# Patient Record
Sex: Female | Born: 1955 | Race: White | Hispanic: No | Marital: Married | Smoking: Never smoker
Health system: Southern US, Community
[De-identification: ages and names within clinical notes are randomized; demographics above are authoritative.]

---

## 2000-09-05 ENCOUNTER — Encounter: Admission: RE | Admit: 2000-09-05 | Discharge: 2000-09-05 | Payer: Self-pay | Admitting: Neurosurgery

## 2000-09-05 ENCOUNTER — Encounter: Payer: Self-pay | Admitting: Neurosurgery

## 2000-09-20 ENCOUNTER — Encounter: Admission: RE | Admit: 2000-09-20 | Discharge: 2000-09-20 | Payer: Self-pay | Admitting: Neurosurgery

## 2000-09-20 ENCOUNTER — Encounter: Payer: Self-pay | Admitting: Neurosurgery

## 2000-10-07 ENCOUNTER — Encounter: Admission: RE | Admit: 2000-10-07 | Discharge: 2000-10-07 | Payer: Self-pay | Admitting: Neurosurgery

## 2000-10-07 ENCOUNTER — Encounter: Payer: Self-pay | Admitting: Neurosurgery

## 2000-12-26 ENCOUNTER — Encounter: Admission: RE | Admit: 2000-12-26 | Discharge: 2001-01-25 | Payer: Self-pay | Admitting: Anesthesiology

## 2001-01-03 ENCOUNTER — Encounter: Payer: Self-pay | Admitting: Neurosurgery

## 2001-01-03 ENCOUNTER — Inpatient Hospital Stay (HOSPITAL_COMMUNITY): Admission: RE | Admit: 2001-01-03 | Discharge: 2001-01-04 | Payer: Self-pay | Admitting: Neurosurgery

## 2001-01-21 ENCOUNTER — Encounter: Payer: Self-pay | Admitting: Neurosurgery

## 2001-01-21 ENCOUNTER — Encounter: Admission: RE | Admit: 2001-01-21 | Discharge: 2001-01-21 | Payer: Self-pay | Admitting: Neurosurgery

## 2004-12-27 ENCOUNTER — Ambulatory Visit: Payer: Self-pay | Admitting: Family Medicine

## 2005-01-08 ENCOUNTER — Other Ambulatory Visit: Admission: RE | Admit: 2005-01-08 | Discharge: 2005-01-08 | Payer: Self-pay | Admitting: Family Medicine

## 2005-01-08 ENCOUNTER — Ambulatory Visit: Payer: Self-pay | Admitting: Family Medicine

## 2005-01-17 ENCOUNTER — Encounter: Admission: RE | Admit: 2005-01-17 | Discharge: 2005-01-17 | Payer: Self-pay | Admitting: Family Medicine

## 2005-02-01 ENCOUNTER — Encounter: Admission: RE | Admit: 2005-02-01 | Discharge: 2005-02-01 | Payer: Self-pay | Admitting: Family Medicine

## 2005-02-01 ENCOUNTER — Encounter (INDEPENDENT_AMBULATORY_CARE_PROVIDER_SITE_OTHER): Payer: Self-pay | Admitting: *Deleted

## 2005-04-06 ENCOUNTER — Ambulatory Visit: Payer: Self-pay | Admitting: Internal Medicine

## 2005-04-26 ENCOUNTER — Ambulatory Visit: Payer: Self-pay | Admitting: Internal Medicine

## 2005-05-17 ENCOUNTER — Ambulatory Visit: Payer: Self-pay | Admitting: Internal Medicine

## 2005-06-12 ENCOUNTER — Ambulatory Visit: Payer: Self-pay | Admitting: Internal Medicine

## 2005-07-11 ENCOUNTER — Ambulatory Visit (HOSPITAL_COMMUNITY): Admission: RE | Admit: 2005-07-11 | Discharge: 2005-07-11 | Payer: Self-pay | Admitting: Neurosurgery

## 2006-02-07 ENCOUNTER — Ambulatory Visit (HOSPITAL_COMMUNITY): Admission: RE | Admit: 2006-02-07 | Discharge: 2006-02-08 | Payer: Self-pay | Admitting: Orthopedic Surgery

## 2010-06-19 ENCOUNTER — Encounter: Payer: Self-pay | Admitting: Neurosurgery

## 2010-10-13 NOTE — Op Note (Signed)
NAMEADRYAN, Catherine Sanchez NO.:  [id number]   MEDICAL RECORD NO.:  [id number]          PATIENT TYPE:  AMB   LOCATION:  DAY                          FACILITY:  [facility]   PHYSICIAN:  Marlowe Kays, M.D.  DATE OF BIRTH:  Sep 26, 1955   DATE OF PROCEDURE:  02/07/2006  DATE OF DISCHARGE:                                 OPERATIVE REPORT   PREOPERATIVE DIAGNOSES:  1. Painful degenerative arthritis, acromioclavicular joint.  2. Rotator cuff tendinopathy, left shoulder.   POSTOPERATIVE DIAGNOSES:  1. Painful degenerative arthritis, acromioclavicular joint.  2. Rotator cuff tendinopathy, left shoulder.   OPERATIONS:  1. Left shoulder arthroscopy with debridement of labrum, and arthroscopic      subacromial decompression.  2. Open resection distal clavicle left shoulder.   SURGEON:  Marlowe Kays, M.D.   ASSISTANTDruscilla Brownie. Underwood, P.A.-C.   ANESTHESIA:  General.   INDICATIONS FOR PROCEDURE:  She has a long and rather complex history of  left shoulder problems complicated by the fact that she has also had a  cervical fusion performed by a neurosurgeon here in town.  She had a left  shoulder 3-hole debridement and decompression, also by another orthopedist,  here in town in 2002.  She has noted progressive pain in her left shoulder  with an MRI demonstrating some residual rotator cuff tendinopathy and edema  of the distal clavicle.  She has failed to have any long term improvement  with injection of steroids of the distal clavicle, consequently she is here  today for the above-mentioned surgery.   PROCEDURE:  After prophylactic antibiotics and satisfactory general  anesthesia, beach chair position on the [place] frame, the left shoulder  girdle was prepped with DuraPrep and draped in a sterile field.  The anatomy  of the shoulder joint was marked out including distal clavicle excision.  All these areas of this incision, subacromial space and lateral and  posterior  portals were infiltrated with 1/2% Marcaine with adrenaline.  Through the posterior soft spot portal, with some difficulty, I was able to  enter the glenohumeral joint. There were some minor fibrous bands, some  degeneration of the labrum, but the biceps tendon and rotator cuff appeared  to be intact.  I advanced the scope between the biceps tendon and  subscapularis using the switching stitch, and over this made an incision  followed by a metal cannula and a 4.2 shaver and I debrided out some of the  fibrous bands and also debrided down the labrum.  I then redirected the  scope in the subacromial area.  I had difficulty not only getting in, but  because of the scar, once I was in there were dense adhesions present.  Through a lateral portal I was able to get a blunt trocar in the subacromial  space but only with Mr. Idolina Primer pulling down, aggressively, on the arm.   Once in, we went through two 4.2 shavers but were unable to breakdown the  fibrous tissue.  We ultimately went to a 5.2 aggressive shaver and I was  finally able to clean up the subacromial space  with it.  As noted in the MRI  she had had a very thorough acromioplasty previously, and we really did not  have to remove much bone, normally a good bit of soft tissue which was  clogging up the subacromial space.  She did have impingement on the rotator  cuff, but from the distal clavicle which I had planned to resect.  Consequently I then removed fluid from the subacromial space and made an  open incision over the distal clavicle.  I identified the [facility] joint and then  measured 1.5 cm medialward, marked the clavicle with the cautery, and then  gently undermined it with small periosteal elevators.  I then placed some  baby Homan's and used a micro saw to resect the clavicle at this point.  There were no residual spurs present.   I covered the raw bone with bone wax, irrigated the wound well and then  filled the resection gap with  Gelfoam.  I then closed the fascia over the  resected area with interrupted #1 Vicryl.  The subcutaneous tissue with 2-0  Vicryl, the skin with Steri-Strips and 3 portals.  This wound was all  infiltrated, once again, with 1/2% Marcaine with adrenaline and the portals  closed with 4-0 Nylon followed by Betadine, Adaptic, dry sterile dressing,  and shoulder immobilizer.  She tolerated the procedure well; and at the time  of this dictation was on her way to the recovery room in satisfactory  condition with no known complications.           ______________________________  Marlowe Kays, M.D.     JA/MEDQ  D:  02/07/2006  T:  02/08/2006  Job:  [id number]

## 2010-10-13 NOTE — H&P (Signed)
[place]  Patient:    Catherine Sanchez, Catherine Sanchez                        MRN: [id number] Adm. Date:  01/03/01 Attending:  Payton Doughty, M.D.                         History and Physical  ADMITTING DIAGNOSIS:  Herniated disk at C5-C6.  SERVICE:  Neurosurgery.  HISTORY OF PRESENT ILLNESS:  A 55 year old right-handed white girl, since February has had pain in neck out of her left shoulder and to her left arm. MRI demonstrated disk at C5-C6 eccentric to the left.  She underwent epidural steroids with no help, has had worsening pain in her left arm, and she is now admitted for an anterior cervical diskectomy and fusion.  MEDICAL HISTORY:  Otherwise unremarkable.  MEDICATIONS:  She is not on any medications.  She has tried numerous anti-inflammatories to no avail.  ALLERGIES:  PENICILLIN.  SURGICAL HISTORY:  Hysterectomy in the past.  SOCIAL HISTORY:  She does not smoke, drinks only socially, and is a housewife, [place], and a lady I have known for a couple of years.  FAMILY HISTORY:  Both parents are deceased - histories are not given.  REVIEW OF SYSTEMS:  Remarkable for neck pain, pain in her arm, and arm weakness.  PHYSICAL EXAMINATION:  HEENT:  Within normal limits.  NECK:  She has reasonable range of motion of her neck and does not reproduce her arm pain.  CHEST:  Clear.  CARDIAC:  Regular rate and rhythm without a murmur.  ABDOMEN:  Nontender and no hepatosplenomegaly.  EXTREMITIES:  Without clubbing or cyanosis.  GENITOURINARY:  Deferred.  PERIPHERAL PULSES:  Good.  NEUROLOGIC:  She is awake, alert, and oriented.  Her cranial nerves are intact.  Motor examination shows 5/5 strength throughout the upper extremities save for giveaway weakness of deltoid and 4/5 weakness in the biceps on the left side.  She has a left C6 and slight C7 sensory deficit.  Reflexes are 2 throughout the right upper extremity; on the left upper extremity the  biceps is absent, triceps is flicker, brachioradialis is 1.  Lower extremities are nonmyelopathic.  LABORATORY DATA:  MRI from March shows a disk at 5-6 with a kyphotic deformity there, probably posturally related.  The axial images do not demonstrate the disk well.  The sagittal images show a disk eccentric to the left side.  CLINICAL IMPRESSION:  Left C6 radiculopathy secondary to herniated disk C5-C6.  PLAN:  The plan is for an anterior cervical diskectomy and fusion.  The risks and benefits of this approach have been discussed with her and she wishes to proceed. DD:  01/03/01 TD:  01/04/01 Job: [id number]

## 2019-04-01 ENCOUNTER — Ambulatory Visit
Admission: RE | Admit: 2019-04-01 | Discharge: 2019-04-01 | Disposition: A | Discharge status: Home / Self Care | Payer: Self-pay | Source: Ambulatory Visit | Attending: Family Medicine | Admitting: Family Medicine

## 2019-04-01 ENCOUNTER — Other Ambulatory Visit: Payer: Self-pay | Admitting: Family Medicine

## 2019-04-01 ENCOUNTER — Other Ambulatory Visit: Payer: Self-pay

## 2019-04-01 DIAGNOSIS — M25512 Pain in left shoulder: Secondary | ICD-10-CM

## 2020-03-30 IMAGING — CR DG SHOULDER 2+V*L*
3 series · 3 of 3 positions shown · non-contrast
Comparison: None.

CLINICAL DATA: Left shoulder pain for 3 months.

EXAM:
LEFT SHOULDER - 2+ VIEW

[w shoulder grashey left]
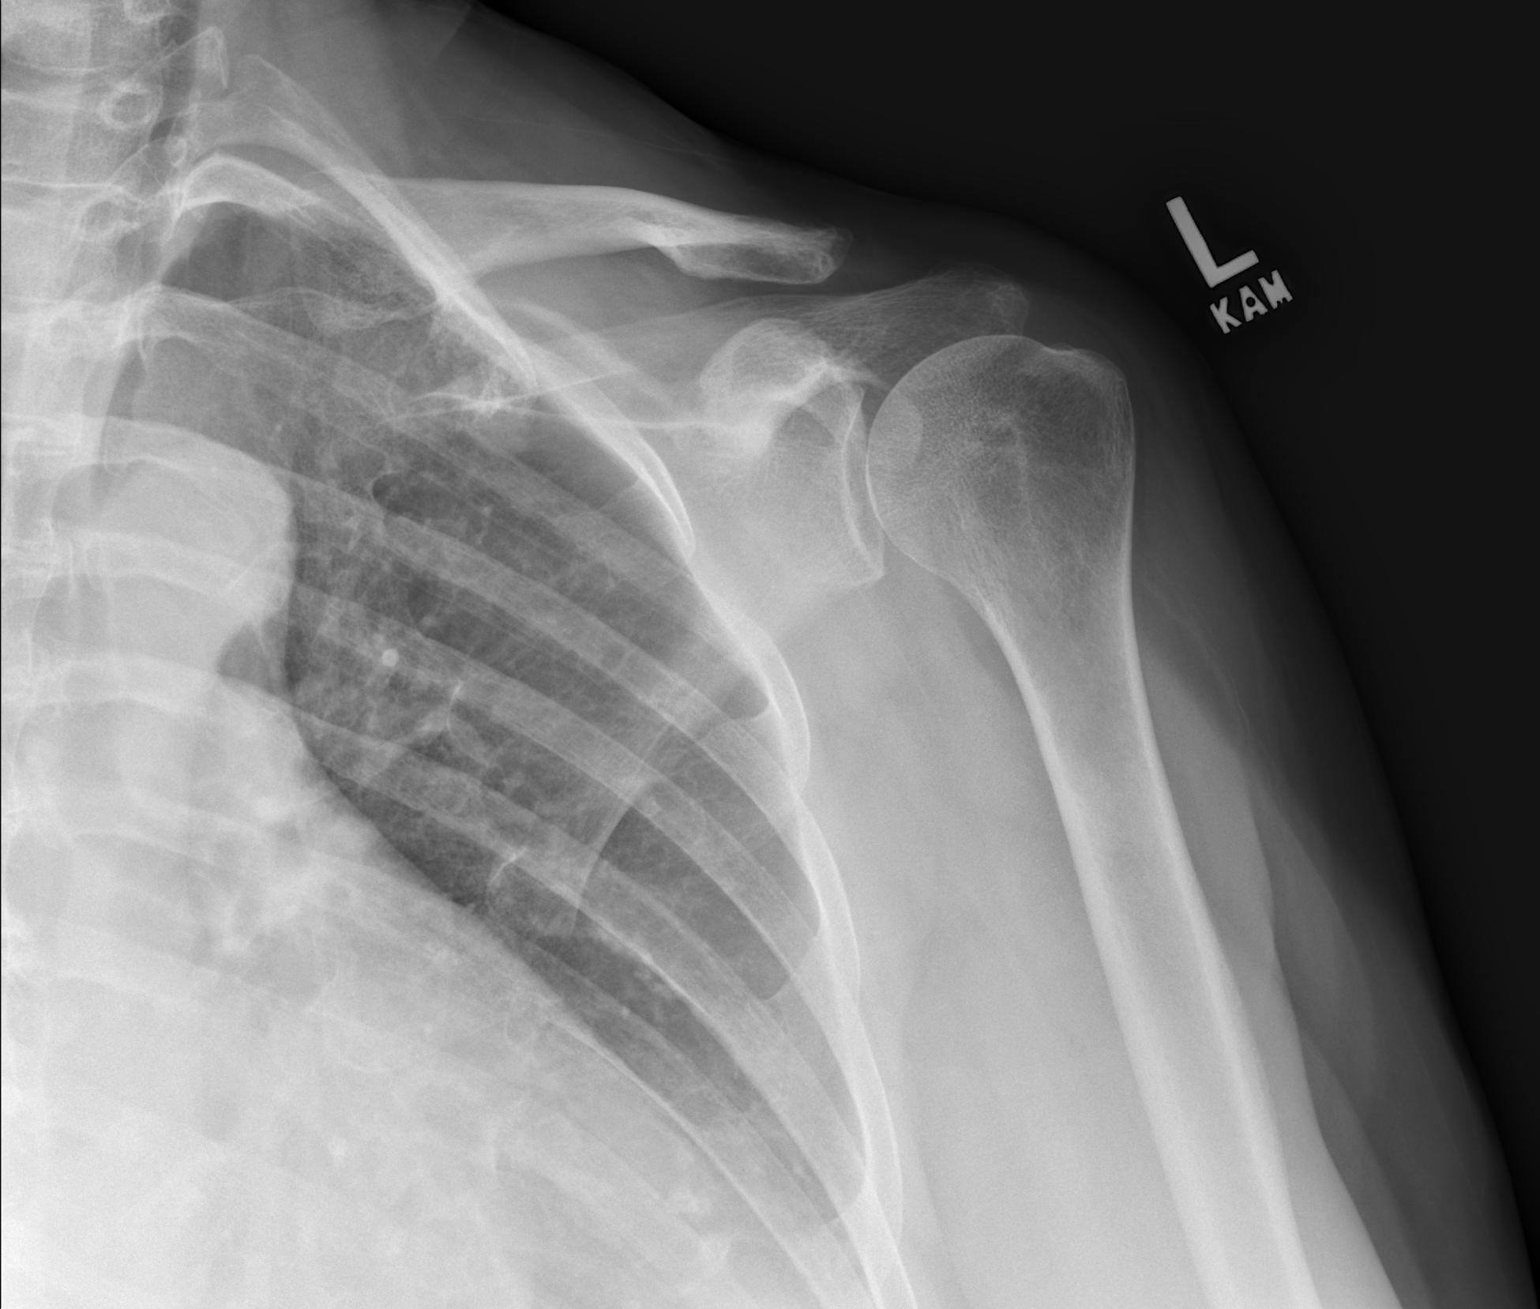

[w shoulder y-view left]
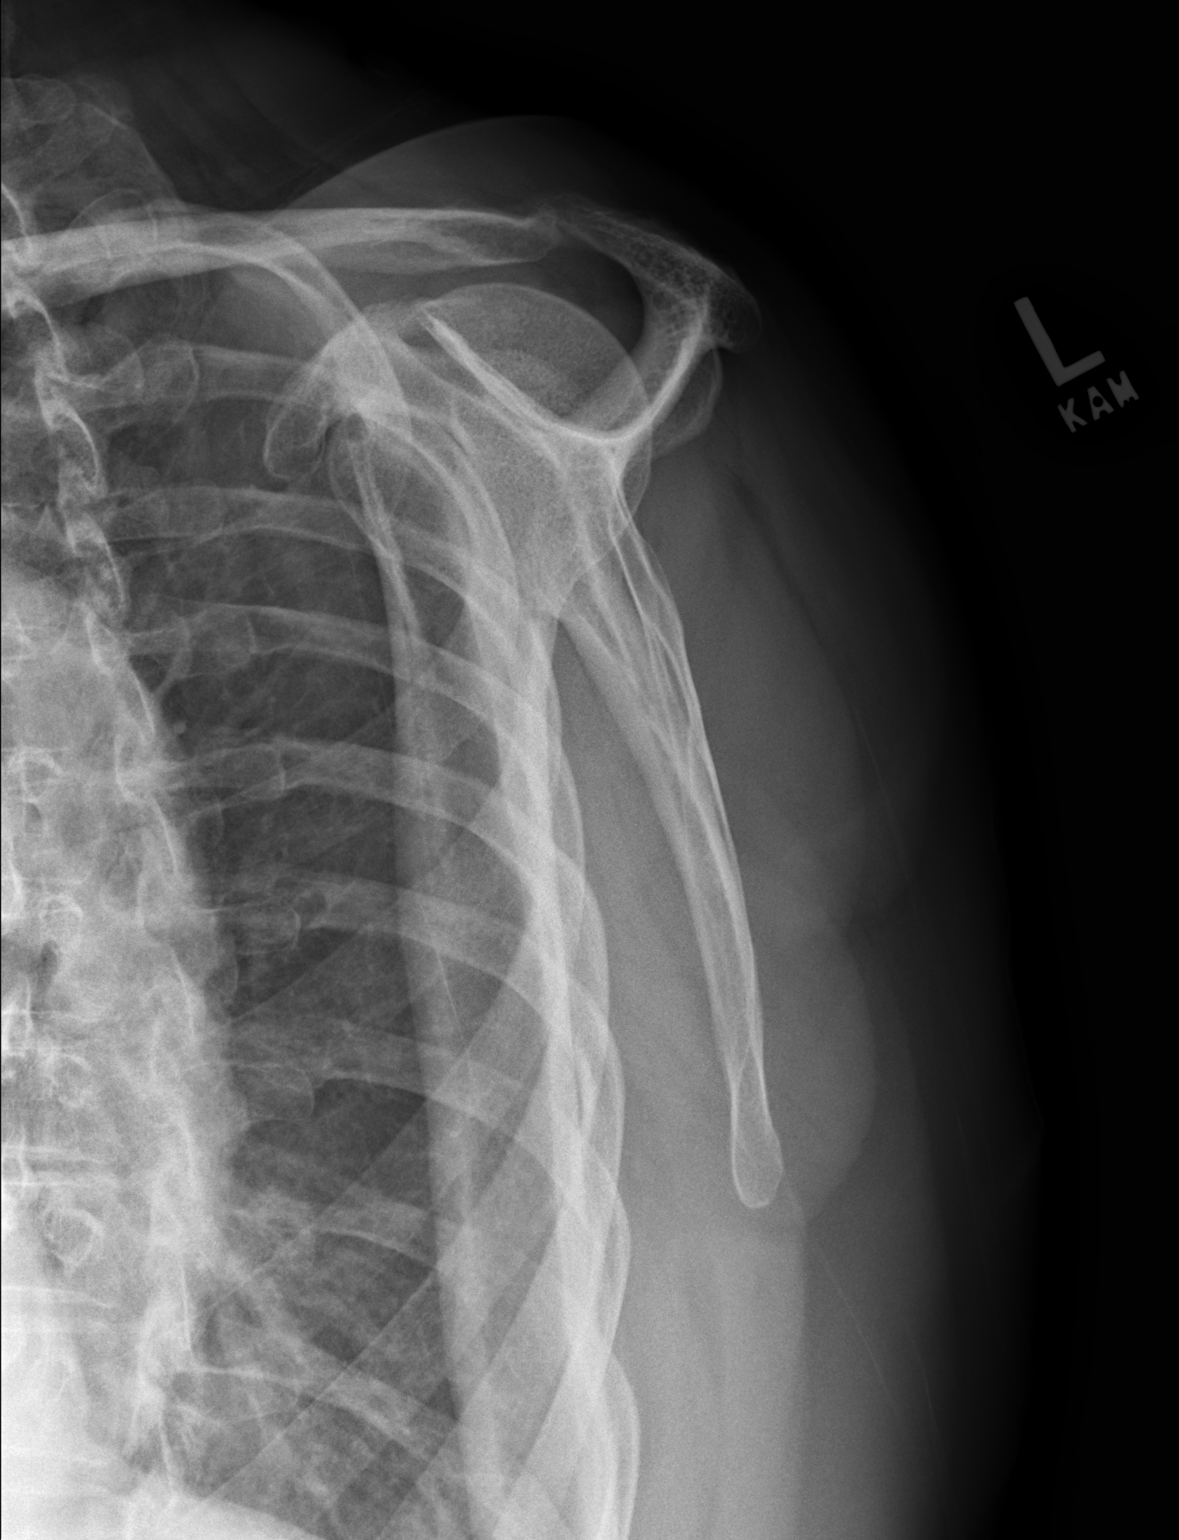

[w shoulder axillary left]
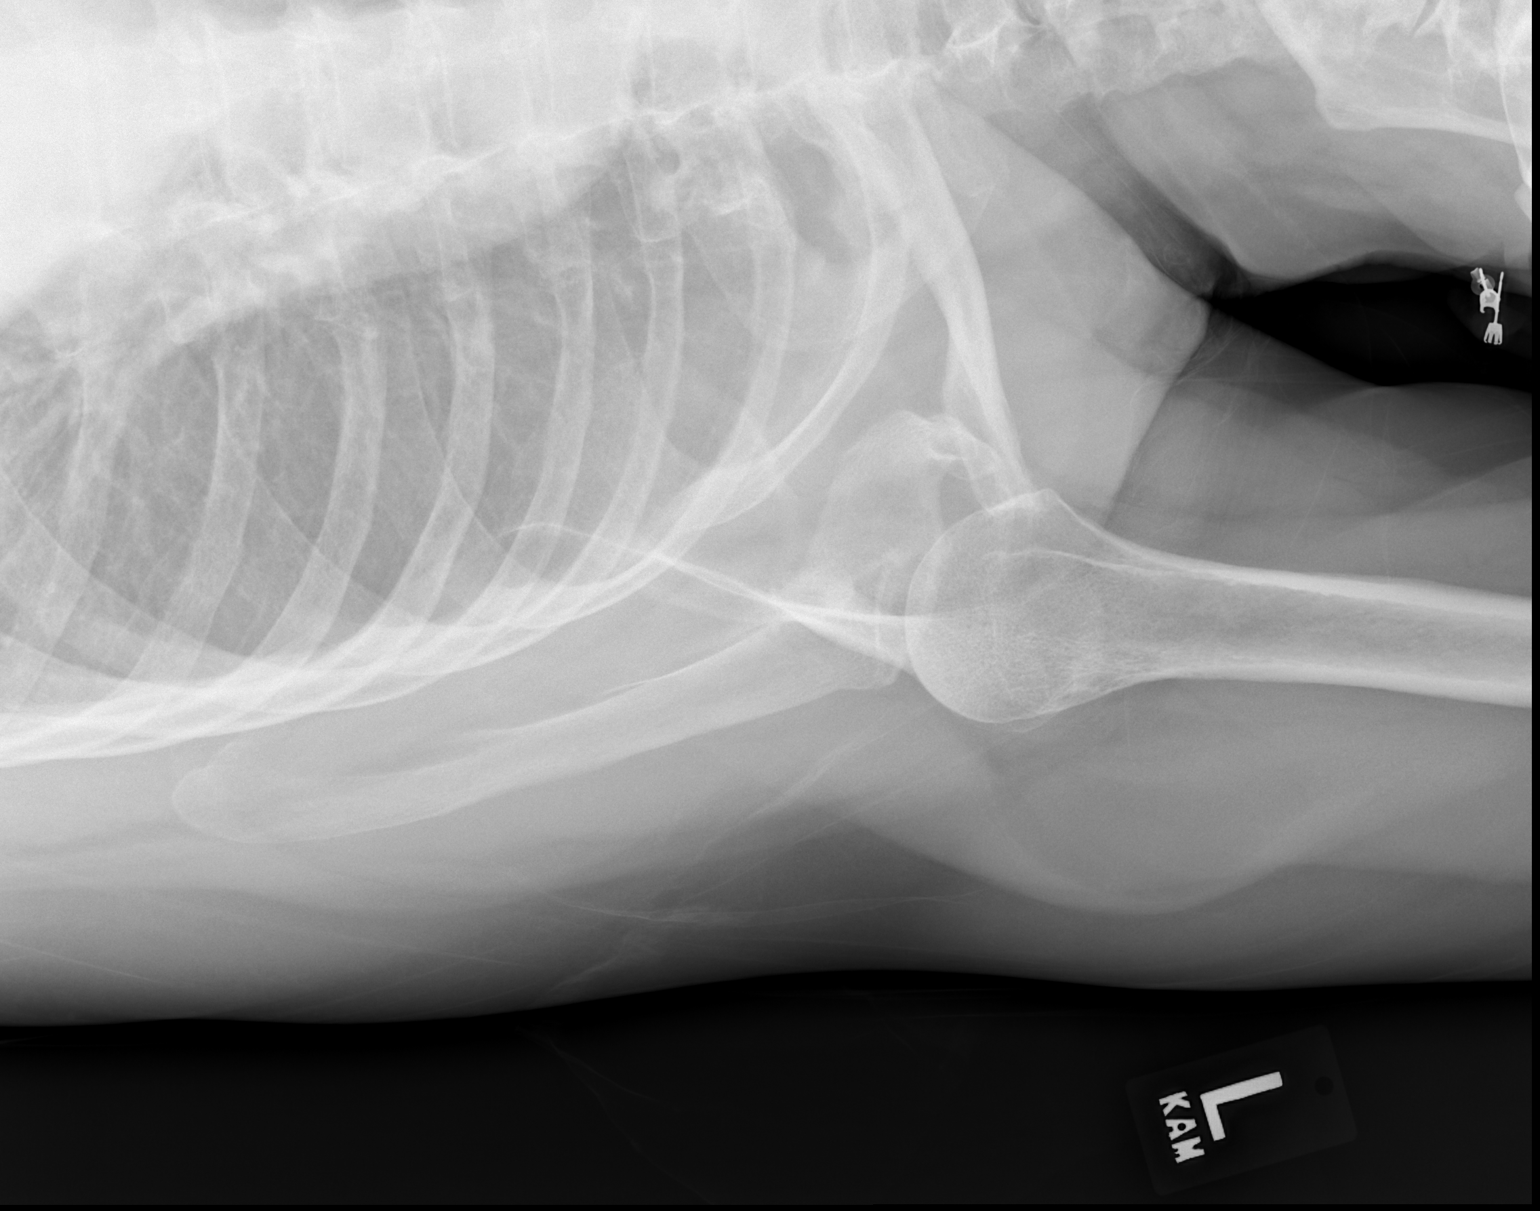

[3 of 3 positions shown; findings below may reference images not displayed]

FINDINGS: There is no evidence of fracture or dislocation. Previous resection
of the distal clavicle. Soft tissues are unremarkable.
IMPRESSION: No acute abnormality. Previous resection of the distal clavicle.

## 2021-11-20 ENCOUNTER — Encounter: Payer: Self-pay | Admitting: Behavioral Health

## 2021-11-20 ENCOUNTER — Ambulatory Visit (INDEPENDENT_AMBULATORY_CARE_PROVIDER_SITE_OTHER): Payer: Self-pay | Admitting: Behavioral Health

## 2021-11-20 VITALS — BP 124/84 | HR 94 | Ht 66.0 in | Wt 161.0 lb

## 2021-11-20 DIAGNOSIS — F902 Attention-deficit hyperactivity disorder, combined type: Secondary | ICD-10-CM

## 2021-11-20 MED ORDER — AMPHETAMINE-DEXTROAMPHETAMINE 20 MG PO TABS: 20.0000 mg | ORAL_TABLET | Freq: Three times a day (TID) | ORAL | 0 refills | Status: DC | PRN

## 2021-11-20 MED ORDER — AMPHETAMINE-DEXTROAMPHET ER 20 MG PO CP24: 20.0000 mg | ORAL_CAPSULE | Freq: Every day | ORAL | 0 refills | Status: DC

## 2021-12-18 ENCOUNTER — Ambulatory Visit (INDEPENDENT_AMBULATORY_CARE_PROVIDER_SITE_OTHER): Payer: Self-pay | Admitting: Behavioral Health

## 2021-12-18 ENCOUNTER — Encounter: Payer: Self-pay | Admitting: Behavioral Health

## 2021-12-18 DIAGNOSIS — F902 Attention-deficit hyperactivity disorder, combined type: Secondary | ICD-10-CM

## 2021-12-18 MED ORDER — AMPHETAMINE-DEXTROAMPHETAMINE 20 MG PO TABS: 20.0000 mg | ORAL_TABLET | Freq: Three times a day (TID) | ORAL | 0 refills | Status: DC | PRN

## 2021-12-18 NOTE — Progress Notes (Addendum)
[facility]  Patient ID: LUDA CHARBONNEAU,  MRN: [id number]  PCP: Elisabeth Most, FNP  Date of Evaluation: 12/18/2021 Time spent:20 minutes  Chief Complaint:  Chief Complaint   ADHD; Follow-up; Hallucinations; Patient Education     66 year old female presents to this office for follow up and medication management.  Says that she is doing very well on Adderall 20 mg three times daily. Says this dose was what always worked for her with Dr. Clarene Duke for many years.  She denies any depression or anxiety at this time or any significant medical health issues. Says that she is sleeping good  7-8 hours per night. Denies mania, no psychosis, no SI/HI.   Past psychiatric medications: Adderall Xanax      HISTORY/CURRENT STATUS: HPI  Individual Medical History/ Review of Systems: Changes? :No   Allergies: Penicillins  Current Medications:  Current Outpatient Medications:    amphetamine-dextroamphetamine (ADDERALL) 20 MG tablet, Take 20 mg by mouth 3 (three) times daily., Disp: , Rfl:    amphetamine-dextroamphetamine (ADDERALL) 20 MG tablet, Take 1 tablet (20 mg total) by mouth 3 (three) times daily as needed., Disp: 90 tablet, Rfl: 0 Medication Side Effects: none  Family Medical/ Social History: Changes? No  MENTAL HEALTH EXAM:  There were no vitals taken for this visit.There is no height or weight on file to calculate BMI.  General Appearance: Casual  Eye Contact:  Good  Speech:  Clear and Coherent  Volume:  Normal  Mood:  NA  Affect:  Appropriate  Thought Process:  Coherent  Orientation:  Negative  Thought Content: Logical   Suicidal Thoughts:  No  Homicidal Thoughts:  No  Memory:  WNL  Judgement:  Good  Insight:  Good  Psychomotor Activity:  Normal  Concentration:  Concentration: Good  Recall:  Good  Fund of Knowledge: Good  Language: Good  Assets:  Desire for Improvement  ADL's:  Intact  Cognition: WNL  Prognosis:  Good    DIAGNOSES:     ICD-10-CM   1. Attention deficit hyperactivity disorder (ADHD), combined type  F90.2 amphetamine-dextroamphetamine (ADDERALL) 20 MG tablet      Receiving Psychotherapy: No    RECOMMENDATIONS:   Greater than 50% of 30 min face to face time with patient was spent on counseling and coordination of care. We discussed her improvement with attention and focus.  She is very happy with her medications right now. Requesting no changes to medications at this time.  We agreed to continue:  Adderall 20 mg IR three times per day To follow up in 3 months to reassess Will call if worsening symptoms or side effects Provided emergency contact info Discussed potential benefits, risks, and side effects of stimulants with patient to include increased heart rate, palpitations, insomnia, increased anxiety, increased irritability, or decreased appetite.  Instructed patient to contact office if experiencing any significant tolerability issues.  Reviewed PDMP     Joan Flores, NP

## 2022-01-07 ENCOUNTER — Encounter (HOSPITAL_COMMUNITY): Payer: Self-pay | Admitting: Emergency Medicine

## 2022-01-07 ENCOUNTER — Other Ambulatory Visit: Payer: Self-pay

## 2022-01-07 ENCOUNTER — Emergency Department (HOSPITAL_COMMUNITY): Payer: No Typology Code available for payment source

## 2022-01-07 ENCOUNTER — Emergency Department (HOSPITAL_COMMUNITY)
Admission: EM | Admit: 2022-01-07 | Discharge: 2022-01-07 | Disposition: A | Discharge status: Home / Self Care | Payer: No Typology Code available for payment source | Attending: Emergency Medicine | Admitting: Emergency Medicine

## 2022-01-07 DIAGNOSIS — F419 Anxiety disorder, unspecified: Secondary | ICD-10-CM | POA: Insufficient documentation

## 2022-01-07 DIAGNOSIS — R0602 Shortness of breath: Secondary | ICD-10-CM | POA: Insufficient documentation

## 2022-01-07 DIAGNOSIS — I4510 Unspecified right bundle-branch block: Secondary | ICD-10-CM

## 2022-01-07 LAB — TROPONIN I (HIGH SENSITIVITY): Troponin I (High Sensitivity): 5 ng/L (ref <18)

## 2022-01-07 LAB — CBC WITH DIFFERENTIAL/PLATELET
Abs Immature Granulocytes: 0.04 10*3/uL (ref 0.00–0.07)
Basophils Absolute: 0 10*3/uL (ref 0.0–0.1)
Basophils Relative: 1 %
Eosinophils Absolute: 0.1 10*3/uL (ref 0.0–0.5)
Eosinophils Relative: 1 %
HCT: 34.6 % — ABNORMAL LOW (ref 36.0–46.0)
Hemoglobin: 12.1 g/dL (ref 12.0–15.0)
Immature Granulocytes: 1 %
Lymphocytes Relative: 50 %
Lymphs Abs: 3.6 10*3/uL (ref 0.7–4.0)
MCH: 35.1 pg — ABNORMAL HIGH (ref 26.0–34.0)
MCHC: 35 g/dL (ref 30.0–36.0)
MCV: 100.3 fL — ABNORMAL HIGH (ref 80.0–100.0)
Monocytes Absolute: 0.4 10*3/uL (ref 0.1–1.0)
Monocytes Relative: 6 %
Neutro Abs: 2.8 10*3/uL (ref 1.7–7.7)
Neutrophils Relative %: 41 %
Platelets: 269 10*3/uL (ref 150–400)
RBC: 3.45 MIL/uL — ABNORMAL LOW (ref 3.87–5.11)
RDW: 13 % (ref 11.5–15.5)
WBC: 6.9 10*3/uL (ref 4.0–10.5)
nRBC: 0 % (ref 0.0–0.2)

## 2022-01-07 LAB — BASIC METABOLIC PANEL
Anion gap: 9 (ref 5–15)
BUN: 26 mg/dL — ABNORMAL HIGH (ref 8–23)
CO2: 21 mmol/L — ABNORMAL LOW (ref 22–32)
Calcium: 7.9 mg/dL — ABNORMAL LOW (ref 8.9–10.3)
Chloride: 112 mmol/L — ABNORMAL HIGH (ref 98–111)
Creatinine, Ser: 0.62 mg/dL (ref 0.44–1.00)
GFR, Estimated: >60 mL/min (ref >60)
Glucose, Bld: 88 mg/dL (ref 70–99)
Potassium: 3.7 mmol/L (ref 3.5–5.1)
Sodium: 142 mmol/L (ref 135–145)

## 2022-01-07 LAB — PROTIME-INR
INR: 1 (ref 0.8–1.2)
Prothrombin Time: 13 seconds (ref 11.4–15.2)

## 2022-01-07 LAB — D-DIMER, QUANTITATIVE: D-Dimer, Quant: 0.33 ug/mL-FEU (ref 0.00–0.50)

## 2022-01-07 MED ORDER — ASPIRIN 81 MG PO CHEW
324.0000 mg | CHEWABLE_TABLET | Freq: Once | ORAL | Status: AC
Start: 2022-01-07 — End: 2022-01-07
  Administered 2022-01-07: 324 mg via ORAL
  Filled 2022-01-07: qty 4

## 2022-01-07 NOTE — ED Triage Notes (Signed)
Pt BIB [facility] for SOB while ambulating while at [employer], was feeling fine prior to shopping tirp. Denies CP, no n/v. Pt reports felt anxious. EMS reports EKG showed possible RBBB and inverted T waves. Placed pt on 2L [place], otherwise VSS. Pt A&O x4 on arrival.

## 2022-01-07 NOTE — Discharge Instructions (Addendum)
The good news is that there has not been any specific abnormalities on your work-up today and it was suggested if you are having a heart attack or any other serious problem with your heart or your lungs.  That being said at the age of 57 it is probably a good idea for you to see a cardiologist since you are having repeated episodes of shortness of breath, they may want to do a stress test or other testing to make sure that everything else is okay with your heart.  Please let them know that you do have a right bundle branch block and they can compare your EKG to today's EKG to see.  This is nothing that you need to be worried about in the meantime however if you do develop increasing shortness of breath or chest pain when you return immediately to the ER.  Until you see the heart doctor please take a baby aspirin every day

## 2022-01-07 NOTE — ED Provider Notes (Signed)
[name] Provider Note   CSN: [id number] Arrival date & time: 01/07/22  2005     History  Chief Complaint  Patient presents with   Shortness of Breath    Catherine Sanchez is a 66 y.o. female.   Shortness of Breath  This patient is a very pleasant 65 year old female, she takes 2 medications, she takes Adderall 3 times a day and she takes Xanax at night to help her sleep.  The patient reports that she feels like she is having anxiety attacks but she is unsure.  She was shopping in the grocery store tonight when she had acute onset of shortness of breath.  This was quite severe, the paramedics were called, there is no chest pain associated with this, they did report prehospital that the EKG showed some right bundle branch block.  The patient states she has never seen a cardiologist and has never had any testing in the ER.  She was given 2 L of nasal cannula for support but did not need it as there was no hypoxia.  Her symptoms have resolved at this point.  She denies any swelling of the legs, any recent travel trauma or immobilization.  She has never had a blood clot.  She has not had any recent surgery.  She denies any fevers but has had some increasing coughing recently.  He is not a smoker and has no history of lung disease.    Home Medications Prior to Admission medications   Medication Sig Start Date End Date Taking? Authorizing Provider  amphetamine-dextroamphetamine (ADDERALL) 20 MG tablet Take 20 mg by mouth 3 (three) times daily. 07/10/21   [provider]  amphetamine-dextroamphetamine (ADDERALL) 20 MG tablet Take 1 tablet (20 mg total) by mouth 3 (three) times daily as needed. 12/18/21 01/17/22  Elwanda Brooklyn, NP      Allergies    Penicillins    Review of Systems   Review of Systems  Respiratory:  Positive for shortness of breath.   All other systems reviewed and are negative.   Physical Exam Updated Vital Signs BP (!)  168/83   Pulse 83   Temp 98.9 F (37.2 C) (Oral)   Resp 17   Ht 1.702 m (5\' 7" )   Wt 68.9 kg   SpO2 96%   BMI 23.81 kg/m  Physical Exam Vitals and nursing note reviewed.  Constitutional:      General: She is not in acute distress.    Appearance: She is well-developed.  HENT:     Head: Normocephalic and atraumatic.     Mouth/Throat:     Pharynx: No oropharyngeal exudate.  Eyes:     General: No scleral icterus.       Right eye: No discharge.        Left eye: No discharge.     Conjunctiva/sclera: Conjunctivae normal.     Pupils: Pupils are equal, round, and reactive to light.  Neck:     Thyroid: No thyromegaly.     Vascular: No JVD.  Cardiovascular:     Rate and Rhythm: Normal rate and regular rhythm.     Heart sounds: Normal heart sounds. No murmur heard.    No friction rub. No gallop.     Comments: Heart rate is currently between 93 and 98 bpm and what appears to be a sinus rhythm.  There is normal pulses at the radial arteries no JVD and no peripheral edema Pulmonary:     Effort:  Pulmonary effort is normal. No respiratory distress.     Breath sounds: Normal breath sounds. No wheezing or rales.  Abdominal:     General: Bowel sounds are normal. There is no distension.     Palpations: Abdomen is soft. There is no mass.     Tenderness: There is no abdominal tenderness.  Musculoskeletal:        General: No tenderness. Normal range of motion.     Cervical back: Normal range of motion and neck supple.     Right lower leg: No edema.     Left lower leg: No edema.  Lymphadenopathy:     Cervical: No cervical adenopathy.  Skin:    General: Skin is warm and dry.     Findings: No erythema or rash.  Neurological:     Mental Status: She is alert.     Coordination: Coordination normal.  Psychiatric:        Behavior: Behavior normal.     ED Results / Procedures / Treatments   Labs (all labs ordered are listed, but only abnormal results are displayed) Labs Reviewed  CBC  WITH DIFFERENTIAL/PLATELET - Abnormal; Notable for the following components:      Result Value   RBC 3.45 (*)    HCT 34.6 (*)    MCV 100.3 (*)    MCH 35.1 (*)    All other components within normal limits  BASIC METABOLIC PANEL - Abnormal; Notable for the following components:   Chloride 112 (*)    CO2 21 (*)    BUN 26 (*)    Calcium 7.9 (*)    All other components within normal limits  D-DIMER, QUANTITATIVE  PROTIME-INR  TROPONIN I (HIGH SENSITIVITY)    EKG EKG Interpretation  Date/Time:  Sunday January 07 2022 20:21:18 EDT Ventricular Rate:  98 PR Interval:  137 QRS Duration: 134 QT Interval:  363 QTC Calculation: 464 R Axis:   268 Text Interpretation: Sinus rhythm RBBB and LAFB Abnormal ekg Since last tracing Right bundle branch block now present (c/w 2007) Confirmed by Eber Hong ([id number]) on 01/07/2022 8:25:48 PM  Radiology DG Chest Port 1 View  Result Date: 01/07/2022 CLINICAL DATA:  Chest pain EXAM: PORTABLE CHEST 1 VIEW COMPARISON:  None Available. FINDINGS: The heart size and mediastinal contours are within normal limits. Both lungs are clear. There is previous surgical fusion in lower cervical spine. IMPRESSION: No active disease. Electronically Signed   By: Ernie Avena M.D.   On: 01/07/2022 20:46    Procedures Procedures    Medications Ordered in ED Medications  aspirin chewable tablet 324 mg (324 mg Oral Given 01/07/22 2111)    ED Course/ Medical Decision Making/ A&P                           Medical Decision Making Amount and/or Complexity of Data Reviewed Labs: ordered. Radiology: ordered.  Risk OTC drugs.   This patient presents to the ED for concern of shortness of breath, this involves an extensive number of treatment options, and is a complaint that carries with it a high risk of complications and morbidity.  The differential diagnosis includes potentially related to the increasing cough, would also consider pneumonia, pneumothorax,  pericarditis, critical coronary disease, pulmonary embolism, anxiety attack   Co morbidities that complicate the patient evaluation  The patient has no significant coronary risk factors other than age of 66 years old   Additional history obtained:  Additional history  obtained from electronic medical record External records from outside source obtained and reviewed including prior EKGs dating back to 2007, no admissions since that time   Lab Tests:  I Ordered, and personally interpreted labs.  The pertinent results include: D-dimer and troponin both unremarkable   Imaging Studies ordered:  I ordered imaging studies including chest x-ray I independently visualized and interpreted imaging which showed no acute findings I agree with the radiologist interpretation   Cardiac Monitoring: / EKG:  The patient was maintained on a cardiac monitor.  I personally viewed and interpreted the cardiac monitored which showed an underlying rhythm of: Normal sinus rhythm with a minus block   Consultations Obtained:  Patient will be referred to cardiology as an outpatient, ambulatory referral ordered   Problem List / ED Course / Critical interventions / Medication management  This patient had shortness of breath but no significant chest pain.  At this time the patient is back to her normal self and has had no tachycardia or hypoxia here.  She has a negative D-dimer and negative troponin and has no history of exertional symptoms suggesting that this is not an acute coronary syndrome.  I discussed with the patient at length today to follow-up closely and gave her an ambulatory referral to cardiology, she expressed her understanding and is very agreeable to the plan. I ordered medication including aspirin for antiplatelet benefits Reevaluation of the patient after these medicines showed that the patient resolved I have reviewed the patients home medicines and have made adjustments as  needed   Social Determinants of Health:  None   Test / Admission - Considered:  Considered admission with the patient is very low risk and can follow-up outpatient with a negative work-up here.  She was agreeable and understands indications for return.  I have discussed with the patient at the bedside the results, and the meaning of these results.  They have expressed her understanding to the need for follow-up with primary care physician         Final Clinical Impression(s) / ED Diagnoses Final diagnoses:  Shortness of breath  Right bundle branch block    Rx / DC Orders ED Discharge Orders          Ordered    Ambulatory referral to Cardiology        01/07/22 2151              Eber Hong, MD 01/07/22 2155

## 2022-01-16 ENCOUNTER — Other Ambulatory Visit: Payer: Self-pay

## 2022-01-16 ENCOUNTER — Telehealth: Payer: Self-pay | Admitting: Behavioral Health

## 2022-01-16 DIAGNOSIS — F902 Attention-deficit hyperactivity disorder, combined type: Secondary | ICD-10-CM

## 2022-01-16 MED ORDER — AMPHETAMINE-DEXTROAMPHETAMINE 20 MG PO TABS: 20.0000 mg | ORAL_TABLET | Freq: Three times a day (TID) | ORAL | 0 refills | Status: DC

## 2022-01-16 MED ORDER — AMPHETAMINE-DEXTROAMPHETAMINE 20 MG PO TABS: 20.0000 mg | ORAL_TABLET | Freq: Three times a day (TID) | ORAL | 0 refills | Status: DC | PRN

## 2022-01-16 NOTE — Telephone Encounter (Signed)
Pended.

## 2022-01-16 NOTE — Telephone Encounter (Signed)
Next visit is 03/15/22. Catherine Sanchez is requesting a refill on her Adderall 20 mg called to:  [id number] - [place], [place] - [address]  Phone:  [phone]  Fax:  [phone]    It is in stock per patient.

## 2022-02-12 ENCOUNTER — Ambulatory Visit: Payer: Self-pay | Admitting: Behavioral Health

## 2022-03-15 ENCOUNTER — Encounter: Payer: Self-pay | Admitting: Behavioral Health

## 2022-03-15 ENCOUNTER — Ambulatory Visit (INDEPENDENT_AMBULATORY_CARE_PROVIDER_SITE_OTHER): Payer: Self-pay | Admitting: Behavioral Health

## 2022-03-15 DIAGNOSIS — F902 Attention-deficit hyperactivity disorder, combined type: Secondary | ICD-10-CM

## 2022-03-15 NOTE — Progress Notes (Signed)
[facility]  Patient ID: Catherine Sanchez,  MRN: [id number]  PCP: Timoteo Gaul, FNP  Date of Evaluation: 03/15/2022 Time spent:30 minutes  Chief Complaint:  Chief Complaint   ADHD; Medication Refill; Patient Education; Follow-up     HISTORY/CURRENT STATUS: HPI  66 year old female presents to this office for follow up and medication management.  No changes this visit.Says that she is doing very well on Adderall 20 mg three times daily. Says this dose was what always worked for her with Dr. Rex Kras for many years.  She denies any depression or anxiety at this time or any significant medical health issues. Says that she is sleeping good  7-8 hours per night. Denies mania, no psychosis, no SI/HI.   Past psychiatric medications: Adderall Xanax    Individual Medical History/ Review of Systems: Changes? :No   Allergies: Penicillins  Current Medications:  Current Outpatient Medications:    amphetamine-dextroamphetamine (ADDERALL) 20 MG tablet, Take 1 tablet (20 mg total) by mouth 3 (three) times daily., Disp: 90 tablet, Rfl: 0   amphetamine-dextroamphetamine (ADDERALL) 20 MG tablet, Take 1 tablet (20 mg total) by mouth 3 (three) times daily as needed., Disp: 90 tablet, Rfl: 0 Medication Side Effects: anxiety  Family Medical/ Social History: Changes? No  MENTAL HEALTH EXAM:  There were no vitals taken for this visit.There is no height or weight on file to calculate BMI.  General Appearance: Casual, Neat, and Well Groomed  Eye Contact:  Good  Speech:  Clear and Coherent  Volume:  Normal  Mood:  NA  Affect:  Appropriate  Thought Process:  Coherent  Orientation:  Full (Time, Place, and Person)  Thought Content: Logical   Suicidal Thoughts:  No  Homicidal Thoughts:  No  Memory:  WNL  Judgement:  Good  Insight:  Good  Psychomotor Activity:  Normal  Concentration:  Concentration: Good  Recall:  Good  Fund of Knowledge: Good  Language: Good  Assets:  Desire  for Improvement  ADL's:  Intact  Cognition: WNL  Prognosis:  Good    DIAGNOSES: No diagnosis found.  Receiving Psychotherapy: No   Greater than 50% of 30 min face to face time with patient was spent on counseling and coordination of care. No changes this visit. We discussed her improvement with attention and focus.  She is very happy with her medications right now. Requesting no changes to medications at this time.  We agreed to continue:   Adderall 20 mg IR three times per day To follow up in 3 months to reassess Will call if worsening symptoms or side effects Provided emergency contact info Discussed potential benefits, risks, and side effects of stimulants with patient to include increased heart rate, palpitations, insomnia, increased anxiety, increased irritability, or decreased appetite.  Instructed patient to contact office if experiencing any significant tolerability issues.  Reviewed PDMP        Elwanda Brooklyn, NP

## 2022-03-16 ENCOUNTER — Other Ambulatory Visit: Payer: Self-pay

## 2022-03-16 ENCOUNTER — Telehealth: Payer: Self-pay | Admitting: Behavioral Health

## 2022-03-16 DIAGNOSIS — F902 Attention-deficit hyperactivity disorder, combined type: Secondary | ICD-10-CM

## 2022-03-16 MED ORDER — AMPHETAMINE-DEXTROAMPHETAMINE 20 MG PO TABS: 20.0000 mg | ORAL_TABLET | Freq: Three times a day (TID) | ORAL | 0 refills | Status: DC | PRN

## 2022-03-16 MED ORDER — AMPHETAMINE-DEXTROAMPHETAMINE 20 MG PO TABS: 20.0000 mg | ORAL_TABLET | Freq: Three times a day (TID) | ORAL | 0 refills | Status: DC

## 2022-03-16 NOTE — Telephone Encounter (Signed)
Patient lvm inquiring about the status of her Adderall refill. Follow up appointment scheduled for 06/18/22 Contact information [phone]

## 2022-03-16 NOTE — Telephone Encounter (Signed)
Pended.

## 2022-05-10 ENCOUNTER — Telehealth: Payer: Self-pay | Admitting: Behavioral Health

## 2022-05-10 NOTE — Telephone Encounter (Signed)
FILLED 11/18 DUE 12/16

## 2022-05-10 NOTE — Telephone Encounter (Signed)
Pt lvm that she needs her adderall 20 mg sent to the [facility] on [address]

## 2022-05-11 ENCOUNTER — Other Ambulatory Visit: Payer: Self-pay

## 2022-05-11 MED ORDER — AMPHETAMINE-DEXTROAMPHETAMINE 20 MG PO TABS: 20.0000 mg | ORAL_TABLET | Freq: Three times a day (TID) | ORAL | 0 refills | Status: DC

## 2022-05-11 NOTE — Telephone Encounter (Signed)
Pended.

## 2022-06-18 ENCOUNTER — Encounter: Payer: Self-pay | Admitting: Behavioral Health

## 2022-06-18 ENCOUNTER — Ambulatory Visit (INDEPENDENT_AMBULATORY_CARE_PROVIDER_SITE_OTHER): Payer: Self-pay | Admitting: Behavioral Health

## 2022-06-18 DIAGNOSIS — F902 Attention-deficit hyperactivity disorder, combined type: Secondary | ICD-10-CM

## 2022-06-18 MED ORDER — AMPHETAMINE-DEXTROAMPHETAMINE 20 MG PO TABS: 20.0000 mg | ORAL_TABLET | Freq: Three times a day (TID) | ORAL | 0 refills | Status: DC

## 2022-06-18 NOTE — Progress Notes (Signed)
[facility]  Patient ID: Catherine Sanchez,  MRN: [id number]  PCP: Timoteo Gaul, FNP  Date of Evaluation: 06/18/2022 Time spent:20 minutes  Chief Complaint:   HISTORY/CURRENT STATUS: HPI 67 year old female presents to this office for follow up and medication management.  No changes this visit.Says that she is doing very well on Adderall 20 mg three times daily. Says this dose was what always worked for her with Dr. Rex Kras for many years.  She denies any depression or anxiety at this time or any significant medical health issues. Says that she is sleeping good  7-8 hours per night. Denies mania, no psychosis, no SI/HI.   Past psychiatric medications: Adderall Xanax  Individual Medical History/ Review of Systems: Changes? :No   Allergies: Penicillins  Current Medications:  Current Outpatient Medications:    amphetamine-dextroamphetamine (ADDERALL) 20 MG tablet, Take 1 tablet (20 mg total) by mouth 3 (three) times daily as needed., Disp: 90 tablet, Rfl: 0   amphetamine-dextroamphetamine (ADDERALL) 20 MG tablet, Take 1 tablet (20 mg total) by mouth 3 (three) times daily., Disp: 90 tablet, Rfl: 0 Medication Side Effects: none  Family Medical/ Social History: Changes? No  MENTAL HEALTH EXAM:  There were no vitals taken for this visit.There is no height or weight on file to calculate BMI.  General Appearance: Casual and Well Groomed  Eye Contact:  Good  Speech:  Clear and Coherent  Volume:  Normal  Mood:  Anxious, Depressed, and Dysphoric  Affect:  Appropriate  Thought Process:  Coherent  Orientation:  Full (Time, Place, and Person)  Thought Content: Logical   Suicidal Thoughts:  No  Homicidal Thoughts:  No  Memory:  WNL  Judgement:  Good  Insight:  Good  Psychomotor Activity:  Normal  Concentration:  Concentration: Good  Recall:  Good  Fund of Knowledge: Good  Language: Good  Assets:  Desire for Improvement  ADL's:  Intact  Cognition: WNL  Prognosis:   Good    DIAGNOSES:    ICD-10-CM   1. Attention deficit hyperactivity disorder (ADHD), combined type  F90.2 amphetamine-dextroamphetamine (ADDERALL) 20 MG tablet      Receiving Psychotherapy: No    RECOMMENDATIONS:   Greater than 50% of 30 min face to face time with patient was spent on counseling and coordination of care. No changes this visit. We discussed her improvement with attention and focus.  She is very happy with her medications right now. Requesting no changes to medications at this time.  We agreed to continue:   Adderall 20 mg IR three times per day To follow up in 3 months to reassess Will call if worsening symptoms or side effects Provided emergency contact info Discussed potential benefits, risks, and side effects of stimulants with patient to include increased heart rate, palpitations, insomnia, increased anxiety, increased irritability, or decreased appetite.  Instructed patient to contact office if experiencing any significant tolerability issues.  Reviewed PDMP       Elwanda Brooklyn, NP

## 2022-07-17 ENCOUNTER — Telehealth: Payer: Self-pay | Admitting: Behavioral Health

## 2022-07-17 ENCOUNTER — Other Ambulatory Visit: Payer: Self-pay

## 2022-07-17 DIAGNOSIS — F902 Attention-deficit hyperactivity disorder, combined type: Secondary | ICD-10-CM

## 2022-07-17 MED ORDER — AMPHETAMINE-DEXTROAMPHETAMINE 20 MG PO TABS: 20.0000 mg | ORAL_TABLET | Freq: Three times a day (TID) | ORAL | 0 refills | Status: DC

## 2022-07-17 NOTE — Telephone Encounter (Signed)
Pt LVM @ 1:04p.  She would like refill of Adderall sent to   [facility] [id number], [place], [place] [address] Phone: [phone]  Fax: [phone]   Next appt 4/22

## 2022-07-17 NOTE — Telephone Encounter (Signed)
Pended.

## 2022-08-13 ENCOUNTER — Other Ambulatory Visit: Payer: Self-pay | Admitting: Behavioral Health

## 2022-08-13 ENCOUNTER — Telehealth: Payer: Self-pay | Admitting: Behavioral Health

## 2022-08-13 DIAGNOSIS — F902 Attention-deficit hyperactivity disorder, combined type: Secondary | ICD-10-CM

## 2022-08-13 MED ORDER — AMPHETAMINE-DEXTROAMPHETAMINE 20 MG PO TABS: 20.0000 mg | ORAL_TABLET | Freq: Three times a day (TID) | ORAL | 0 refills | Status: DC

## 2022-08-13 NOTE — Telephone Encounter (Signed)
Pt called at 10:45a.  She would like refill of  Adderall 20mg  90# sent to   [facility] [id number] [facility], [place] [address] [place], [place] [address] Phone: [phone]  Fax: [phone]   Next appt 4/22

## 2022-09-17 ENCOUNTER — Ambulatory Visit (INDEPENDENT_AMBULATORY_CARE_PROVIDER_SITE_OTHER): Payer: Self-pay | Admitting: Behavioral Health

## 2022-09-17 ENCOUNTER — Encounter: Payer: Self-pay | Admitting: Behavioral Health

## 2022-09-17 DIAGNOSIS — F902 Attention-deficit hyperactivity disorder, combined type: Secondary | ICD-10-CM

## 2022-09-17 MED ORDER — AMPHETAMINE-DEXTROAMPHETAMINE 20 MG PO TABS: 20.0000 mg | ORAL_TABLET | Freq: Three times a day (TID) | ORAL | 0 refills | Status: DC

## 2022-09-17 NOTE — Progress Notes (Signed)
[facility]  Patient ID: Catherine Sanchez,  MRN: [id number]  PCP: Elisabeth Most, FNP  Date of Evaluation: 09/17/2022 Time spent:20 minutes  Chief Complaint:  Chief Complaint   ADHD; Follow-up; Patient Education; Medication Refill     HISTORY/CURRENT STATUS: HPI 67 year old female presents to this office for follow up and medication management.  No changes this visit.Says that she is doing very well on Adderall 20 mg three times daily.  She denies any depression or anxiety at this time or any significant medical health issues. Says that she is sleeping good  7-8 hours per night. Denies mania, no psychosis, no SI/HI.   Past psychiatric medications: Adderall Xanax  Individual Medical History/ Review of Systems: Changes? :No  Individual Medical History/ Review of Systems: Changes? :No   Allergies: Penicillins  Current Medications:  Current Outpatient Medications:    amphetamine-dextroamphetamine (ADDERALL) 20 MG tablet, Take 1 tablet (20 mg total) by mouth 3 (three) times daily as needed., Disp: 90 tablet, Rfl: 0   amphetamine-dextroamphetamine (ADDERALL) 20 MG tablet, Take 1 tablet (20 mg total) by mouth 3 (three) times daily., Disp: 90 tablet, Rfl: 0 Medication Side Effects: none  Family Medical/ Social History: Changes? No  MENTAL HEALTH EXAM:  There were no vitals taken for this visit.There is no height or weight on file to calculate BMI.  General Appearance: Casual, Neat, and Well Groomed  Eye Contact:  Good  Speech:  Clear and Coherent  Volume:  Normal  Mood:  NA  Affect:  Appropriate  Thought Process:  Coherent  Orientation:  Full (Time, Place, and Person)  Thought Content: Logical   Suicidal Thoughts:  No  Homicidal Thoughts:  No  Memory:  WNL  Judgement:  Good  Insight:  Good  Psychomotor Activity:  Normal  Concentration:  Concentration: Good  Recall:  Good  Fund of Knowledge: Good  Language: Good  Assets:  Desire for Improvement  ADL's:   Intact  Cognition: WNL  Prognosis:  Good    DIAGNOSES:    ICD-10-CM   1. Attention deficit hyperactivity disorder (ADHD), combined type  F90.2 amphetamine-dextroamphetamine (ADDERALL) 20 MG tablet      Receiving Psychotherapy: No    RECOMMENDATIONS:   Greater than 50% of 30 min face to face time with patient was spent on counseling and coordination of care. No changes this visit. We discussed her improvement with attention and focus.  She is very happy with her medications right now. Requesting no changes to medications at this time.  We agreed to continue:   Adderall 20 mg IR three times per day To follow up in 3 months to reassess Will call if worsening symptoms or side effects Provided emergency contact info Discussed potential benefits, risks, and side effects of stimulants with patient to include increased heart rate, palpitations, insomnia, increased anxiety, increased irritability, or decreased appetite.  Instructed patient to contact office if experiencing any significant tolerability issues.  Reviewed PDMP    Joan Flores, NP

## 2022-10-15 ENCOUNTER — Other Ambulatory Visit: Payer: Self-pay | Admitting: Behavioral Health

## 2022-10-15 ENCOUNTER — Telehealth: Payer: Self-pay | Admitting: Behavioral Health

## 2022-10-15 DIAGNOSIS — F902 Attention-deficit hyperactivity disorder, combined type: Secondary | ICD-10-CM

## 2022-10-15 MED ORDER — AMPHETAMINE-DEXTROAMPHETAMINE 20 MG PO TABS: 20.0000 mg | ORAL_TABLET | Freq: Three times a day (TID) | ORAL | 0 refills | Status: DC

## 2022-10-15 NOTE — Telephone Encounter (Signed)
Pt requesting Rx for adderall 20 mg 3/d. [facility] [place]. Apt 7/22

## 2022-11-14 ENCOUNTER — Other Ambulatory Visit: Payer: Self-pay | Admitting: Behavioral Health

## 2022-11-14 ENCOUNTER — Telehealth: Payer: Self-pay | Admitting: Behavioral Health

## 2022-11-14 DIAGNOSIS — F902 Attention-deficit hyperactivity disorder, combined type: Secondary | ICD-10-CM

## 2022-11-14 MED ORDER — AMPHETAMINE-DEXTROAMPHETAMINE 20 MG PO TABS: 20.0000 mg | ORAL_TABLET | Freq: Three times a day (TID) | ORAL | 0 refills | Status: DC

## 2022-11-14 NOTE — Telephone Encounter (Signed)
Pt requesting generic adderall 20 mg 3/d #90 Rx to [facility]. APT 7/22

## 2022-12-17 ENCOUNTER — Encounter: Payer: Self-pay | Admitting: Behavioral Health

## 2022-12-17 ENCOUNTER — Ambulatory Visit: Payer: Self-pay | Admitting: Behavioral Health

## 2022-12-17 DIAGNOSIS — F902 Attention-deficit hyperactivity disorder, combined type: Secondary | ICD-10-CM

## 2022-12-17 MED ORDER — AMPHETAMINE-DEXTROAMPHETAMINE 20 MG PO TABS
20.0000 mg | ORAL_TABLET | Freq: Three times a day (TID) | ORAL | 0 refills | Status: DC
Start: 2022-12-17 — End: 2023-01-15

## 2022-12-17 NOTE — Progress Notes (Signed)
[facility]  Patient ID: Catherine Sanchez,  MRN: [id number]  PCP: Elisabeth Most, FNP  Date of Evaluation: 12/17/2022 Time spent:30 minutes  Chief Complaint:  Chief Complaint   ADHD; Follow-up; Medication Refill; Patient Education     HISTORY/CURRENT STATUS: HPI  67 year old female presents to this office for follow up and medication management.  No changes this visit.Says that she is doing very well on Adderall 20 mg three times daily.  She denies any depression or anxiety at this time or any significant medical health issues. Says that she is sleeping good  7-8 hours per night. Denies mania, no psychosis, no SI/HI.   Past psychiatric medications: Adderall Xanax      Individual Medical History/ Review of Systems: Changes? :No   Allergies: Penicillins  Current Medications:  Current Outpatient Medications:    amphetamine-dextroamphetamine (ADDERALL) 20 MG tablet, Take 1 tablet (20 mg total) by mouth 3 (three) times daily as needed., Disp: 90 tablet, Rfl: 0   amphetamine-dextroamphetamine (ADDERALL) 20 MG tablet, Take 1 tablet (20 mg total) by mouth 3 (three) times daily., Disp: 90 tablet, Rfl: 0 Medication Side Effects: none  Family Medical/ Social History: Changes? No  MENTAL HEALTH EXAM:  There were no vitals taken for this visit.There is no height or weight on file to calculate BMI.  General Appearance: Casual, Neat, and Well Groomed  Eye Contact:  Good  Speech:  Clear and Coherent  Volume:  Normal  Mood:  NA  Affect:  Appropriate  Thought Process:  Coherent  Orientation:  Full (Time, Place, and Person)  Thought Content: Logical   Suicidal Thoughts:  No  Homicidal Thoughts:  No  Memory:  WNL  Judgement:  Good  Insight:  Good  Psychomotor Activity:  Normal  Concentration:  Concentration: Good  Recall:  Good  Fund of Knowledge: Good  Language: Good  Assets:  Desire for Improvement  ADL's:  Intact  Cognition: WNL  Prognosis:  Good     DIAGNOSES:    ICD-10-CM   1. Attention deficit hyperactivity disorder (ADHD), combined type  F90.2 amphetamine-dextroamphetamine (ADDERALL) 20 MG tablet      Receiving Psychotherapy: No    RECOMMENDATIONS:   Greater than 50% of 30 min face to face time with patient was spent on counseling and coordination of care. No changes this visit. We discussed her improvement with attention and focus.  She is very happy with her medications right now. Requesting no changes to medications at this time.  We agreed to continue:   Adderall 20 mg IR three times per day To follow up in 3 months to reassess Will call if worsening symptoms or side effects Provided emergency contact info Discussed potential benefits, risks, and side effects of stimulants with patient to include increased heart rate, palpitations, insomnia, increased anxiety, increased irritability, or decreased appetite.  Instructed patient to contact office if experiencing any significant tolerability issues.  Reviewed PDMP     Joan Flores, NP

## 2023-01-15 ENCOUNTER — Other Ambulatory Visit: Payer: Self-pay

## 2023-01-15 ENCOUNTER — Telehealth: Payer: Self-pay | Admitting: Behavioral Health

## 2023-01-15 DIAGNOSIS — F902 Attention-deficit hyperactivity disorder, combined type: Secondary | ICD-10-CM

## 2023-01-15 MED ORDER — AMPHETAMINE-DEXTROAMPHETAMINE 20 MG PO TABS
20.0000 mg | ORAL_TABLET | Freq: Three times a day (TID) | ORAL | 0 refills | Status: DC
Start: 2023-01-15 — End: 2023-02-14

## 2023-01-15 NOTE — Telephone Encounter (Signed)
Pended.

## 2023-01-15 NOTE — Telephone Encounter (Signed)
Pt lvm that she needs a refill on her adderall 20 mg. Pharmacy is [facility] [address]

## 2023-02-14 ENCOUNTER — Telehealth: Payer: Self-pay | Admitting: Behavioral Health

## 2023-02-14 ENCOUNTER — Other Ambulatory Visit: Payer: Self-pay

## 2023-02-14 DIAGNOSIS — F902 Attention-deficit hyperactivity disorder, combined type: Secondary | ICD-10-CM

## 2023-02-14 MED ORDER — AMPHETAMINE-DEXTROAMPHETAMINE 20 MG PO TABS
20.0000 mg | ORAL_TABLET | Freq: Three times a day (TID) | ORAL | 0 refills | Status: DC
Start: 2023-02-14 — End: 2023-03-15

## 2023-02-14 NOTE — Telephone Encounter (Signed)
Patient called in for refill on Adderall 20mg . PH: [phone] Appt 10/18 Pharmacy [facility] [address] [place], [place]

## 2023-02-14 NOTE — Telephone Encounter (Signed)
Pended.

## 2023-03-15 ENCOUNTER — Ambulatory Visit (INDEPENDENT_AMBULATORY_CARE_PROVIDER_SITE_OTHER): Payer: Self-pay | Admitting: Behavioral Health

## 2023-03-15 ENCOUNTER — Encounter: Payer: Self-pay | Admitting: Behavioral Health

## 2023-03-15 DIAGNOSIS — F902 Attention-deficit hyperactivity disorder, combined type: Secondary | ICD-10-CM

## 2023-03-15 MED ORDER — AMPHETAMINE-DEXTROAMPHETAMINE 20 MG PO TABS
20.0000 mg | ORAL_TABLET | Freq: Three times a day (TID) | ORAL | 0 refills | Status: DC
Start: 2023-03-15 — End: 2023-04-11

## 2023-03-15 NOTE — Progress Notes (Signed)
[facility]  Patient ID: Catherine Sanchez,  MRN: [id number]  PCP: Elisabeth Most, FNP  Date of Evaluation: 03/15/2023 Time spent:30 minutes  Chief Complaint:  Chief Complaint   Follow-up; Medication Refill; Patient Education; ADHD     HISTORY/CURRENT STATUS: HPI  67 year old female presents to this office for follow up and medication management.  No changes this visit.Says that she continues to do very well on Adderall 20 mg three times daily.  She denies any depression or anxiety at this time or any significant medical health issues. Says that she is sleeping good  7-8 hours per night. Denies mania, no psychosis, no SI/HI.   Past psychiatric medications: Adderall Xanax     Individual Medical History/ Review of Systems: Changes? :No   Allergies: Penicillins  Current Medications:  Current Outpatient Medications:    amphetamine-dextroamphetamine (ADDERALL) 20 MG tablet, Take 1 tablet (20 mg total) by mouth 3 (three) times daily as needed., Disp: 90 tablet, Rfl: 0   amphetamine-dextroamphetamine (ADDERALL) 20 MG tablet, Take 1 tablet (20 mg total) by mouth 3 (three) times daily., Disp: 90 tablet, Rfl: 0 Medication Side Effects: none  Family Medical/ Social History: Changes? No  MENTAL HEALTH EXAM:  There were no vitals taken for this visit.There is no height or weight on file to calculate BMI.  General Appearance: Casual, Neat, and Well Groomed  Eye Contact:  Good  Speech:  Clear and Coherent  Volume:  Normal  Mood:  NA  Affect:  Appropriate  Thought Process:  Coherent  Orientation:  Full (Time, Place, and Person)  Thought Content: Logical   Suicidal Thoughts:  No  Homicidal Thoughts:  No  Memory:  WNL  Judgement:  Good  Insight:  Good  Psychomotor Activity:  Normal  Concentration:  Concentration: Good  Recall:  Good  Fund of Knowledge: Good  Language: Good  Assets:  Desire for Improvement  ADL's:  Intact  Cognition: WNL  Prognosis:  Good     DIAGNOSES:    ICD-10-CM   1. Attention deficit hyperactivity disorder (ADHD), combined type  F90.2 amphetamine-dextroamphetamine (ADDERALL) 20 MG tablet      Receiving Psychotherapy: No    RECOMMENDATIONS:   Greater than 50% of 30 min face to face time with patient was spent on counseling and coordination of care. No changes this visit. We discussed her improvement with attention and focus.  She is very happy with her medications right now. Requesting no changes to medications at this time.  We agreed to continue:   Adderall 20 mg IR three times per day To follow up in 3 months to reassess Will call if worsening symptoms or side effects Provided emergency contact info Discussed potential benefits, risks, and side effects of stimulants with patient to include increased heart rate, palpitations, insomnia, increased anxiety, increased irritability, or decreased appetite.  Instructed patient to contact office if experiencing any significant tolerability issues.  Reviewed PDMP       Joan Flores, NP

## 2023-04-11 ENCOUNTER — Telehealth: Payer: Self-pay | Admitting: Behavioral Health

## 2023-04-11 ENCOUNTER — Other Ambulatory Visit: Payer: Self-pay

## 2023-04-11 DIAGNOSIS — F902 Attention-deficit hyperactivity disorder, combined type: Secondary | ICD-10-CM

## 2023-04-11 NOTE — Telephone Encounter (Signed)
Next appt is 06/14/23. Requesting refill on Adderall 20 mg called to:  [facility] [id number] [place], [place] - [address]   Phone: [phone]  Fax: [phone]   It is in stock per patient.

## 2023-04-11 NOTE — Telephone Encounter (Signed)
Pended.

## 2023-04-12 MED ORDER — AMPHETAMINE-DEXTROAMPHETAMINE 20 MG PO TABS
20.0000 mg | ORAL_TABLET | Freq: Three times a day (TID) | ORAL | 0 refills | Status: DC
Start: 2023-04-12 — End: 2023-06-14

## 2023-05-13 ENCOUNTER — Telehealth: Payer: Self-pay | Admitting: Behavioral Health

## 2023-05-13 ENCOUNTER — Other Ambulatory Visit: Payer: Self-pay

## 2023-05-13 DIAGNOSIS — F902 Attention-deficit hyperactivity disorder, combined type: Secondary | ICD-10-CM

## 2023-05-13 MED ORDER — AMPHETAMINE-DEXTROAMPHETAMINE 20 MG PO TABS
20.0000 mg | ORAL_TABLET | Freq: Three times a day (TID) | ORAL | 0 refills | Status: DC | PRN
Start: 2023-05-13 — End: 2023-08-15

## 2023-05-13 NOTE — Telephone Encounter (Signed)
Pended Adderall #90 to [facility].

## 2023-05-13 NOTE — Telephone Encounter (Signed)
Requesting refill for Adderall 20 mg  called to:  [facility] [id number] [place], [place] - [address] Phone: [phone]           Phone: [phone]  Fax: [phone]    She has none left and its in stock at [facility].

## 2023-05-13 NOTE — Telephone Encounter (Signed)
Next appt is 06/14/23. Requesting refill on Adderall 20 mg

## 2023-06-14 ENCOUNTER — Ambulatory Visit: Payer: Self-pay | Admitting: Behavioral Health

## 2023-06-14 ENCOUNTER — Other Ambulatory Visit: Payer: Self-pay

## 2023-06-14 ENCOUNTER — Telehealth: Payer: Self-pay | Admitting: Behavioral Health

## 2023-06-14 DIAGNOSIS — F902 Attention-deficit hyperactivity disorder, combined type: Secondary | ICD-10-CM

## 2023-06-14 MED ORDER — AMPHETAMINE-DEXTROAMPHETAMINE 20 MG PO TABS
20.0000 mg | ORAL_TABLET | Freq: Three times a day (TID) | ORAL | 0 refills | Status: DC
Start: 2023-06-14 — End: 2023-07-17

## 2023-06-14 NOTE — Telephone Encounter (Signed)
Pended 20 mg Adderall, #90, to [facility].

## 2023-06-14 NOTE — Telephone Encounter (Signed)
Catherine Sanchez came in for appt at 11:00, but we had cancelled the appt because [name] is out this morning.  She is out of the medication and needs it sent in today.  She RS for 07/02/23.  Please send RX to [facility] [id number] [place], [place] - [address]   She comes from [place] and to avoid having to drive back to [place] please send asap.

## 2023-06-24 ENCOUNTER — Ambulatory Visit: Payer: Self-pay | Admitting: Family Medicine

## 2023-06-24 NOTE — Telephone Encounter (Signed)
  Chief Complaint: Suspected Carbon Monoxide exposure Symptoms: dizziness, headache, heart palpitations Frequency: couple of weeks Pertinent Negatives: Patient denies vomiting Disposition: [x] ED /[] Urgent Care (no appt availability in office) / [] Appointment(In office/virtual)/ []  [facility]/ [] Home Care/ [] Refused Recommended Disposition /[] [facility]/ []  Follow-up with PCP Additional Notes: patient called with suspected carbon monoxide exposure. Patient states that her husband found holes in the pipes leading away from their furnace. Patient endorses symptoms of exposure. Per protocol, patient given the recommendation of the ED. Patient verbalized understanding and all questions answered.   Copied from CRM [id number]. Topic: Clinical - Red Word Triage >> Jun 24, 2023  2:32 PM Joanette Gula wrote: Red Word that prompted transfer to Nurse Triage: Carbon Monoxide Exposure Reason for Disposition  Patient sounds very sick or weak to the triager  Answer Assessment - Initial Assessment Questions 1. SYMPTOMS: "What symptoms are you having?" (e.g., none, headache, dizziness, nausea)     Dizziness, headache, heart palpitations, vision problems 2. ONSET:  "When did the symptoms begin?"     Started beginning of January 3. SOURCE OF EXPOSURE: "What happened?" (e.g., broken furnace, home fire)     Holes in the pipes of furnace 4. CO EXPOSURE:  "Were you exposed to carbon monoxide?"    - KNOWN - high CO measured in air; coworker or family member diagnosed with CO poisoning;     - SUSPECTED - CO alarm sounded; exposure to car fumes, burning charcoal, burning kerosene, or other gas burning device.     Exposure to CO from furnace 5. OTHERS: "Is anyone else having similar/same symptoms?"      Her husband was having similar problems 6. TREATMENT: "What have you done so far to treat this?" (e.g., open the windows, go outside)     Open windows, close doors to room  Protocols used: Carbon  Monoxide Exposure-A-AH

## 2023-07-02 ENCOUNTER — Ambulatory Visit: Payer: Self-pay | Admitting: Behavioral Health

## 2023-07-17 ENCOUNTER — Ambulatory Visit (INDEPENDENT_AMBULATORY_CARE_PROVIDER_SITE_OTHER): Payer: Self-pay | Admitting: Behavioral Health

## 2023-07-17 ENCOUNTER — Encounter: Payer: Self-pay | Admitting: Behavioral Health

## 2023-07-17 DIAGNOSIS — F902 Attention-deficit hyperactivity disorder, combined type: Secondary | ICD-10-CM

## 2023-07-17 MED ORDER — AMPHETAMINE-DEXTROAMPHETAMINE 20 MG PO TABS
20.0000 mg | ORAL_TABLET | Freq: Three times a day (TID) | ORAL | 0 refills | Status: DC
Start: 2023-07-17 — End: 2023-08-15

## 2023-07-17 NOTE — Progress Notes (Signed)
[facility]  Patient ID: Catherine Sanchez,  MRN: [id number]  PCP: Elisabeth Most, FNP  Date of Evaluation: 07/17/2023 Time spent:20 minutes  Chief Complaint:  Chief Complaint   Anxiety; Depression; Follow-up; Patient Education; Medication Refill     HISTORY/CURRENT STATUS: HPI 68 year old female presents to this office for follow up and medication management.  No changes this visit.Says that she continues to do very well on Adderall 20 mg three times daily.  She denies any depression or anxiety at this time or any significant medical health issues. Says that she is sleeping good  7-8 hours per night. Denies mania, no psychosis, no SI/HI.   Past psychiatric medications: Adderall Xanax  Individual Medical History/ Review of Systems: Changes? :No   Allergies: Penicillins  Current Medications:  Current Outpatient Medications:    amphetamine-dextroamphetamine (ADDERALL) 20 MG tablet, Take 1 tablet (20 mg total) by mouth 3 (three) times daily as needed., Disp: 90 tablet, Rfl: 0   amphetamine-dextroamphetamine (ADDERALL) 20 MG tablet, Take 1 tablet (20 mg total) by mouth 3 (three) times daily., Disp: 90 tablet, Rfl: 0 Medication Side Effects: none  Family Medical/ Social History: Changes? No  MENTAL HEALTH EXAM:  There were no vitals taken for this visit.There is no height or weight on file to calculate BMI.  General Appearance: Casual and Neat  Eye Contact:  Good  Speech:  Clear and Coherent  Volume:  Normal  Mood:  NA  Affect:  Appropriate  Thought Process:  Coherent  Orientation:  Full (Time, Place, and Person)  Thought Content: Logical   Suicidal Thoughts:  No  Homicidal Thoughts:  No  Memory:  WNL  Judgement:  Good  Insight:  Good  Psychomotor Activity:  Normal  Concentration:  Concentration: Good  Recall:  Good  Fund of Knowledge: Good  Language: Good  Assets:  Desire for Improvement  ADL's:  Intact  Cognition: WNL  Prognosis:  Good     DIAGNOSES:    ICD-10-CM   1. Attention deficit hyperactivity disorder (ADHD), combined type  F90.2 amphetamine-dextroamphetamine (ADDERALL) 20 MG tablet      Receiving Psychotherapy: No    RECOMMENDATIONS:   Greater than 50% of 30 min face to face time with patient was spent on counseling and coordination of care. No changes this visit. We discussed her improvement with attention and focus.  She is very happy with her medications right now. Requesting no changes to medications at this time.  We agreed to continue:   Adderall 20 mg IR three times per day To follow up in 3 months to reassess Will call if worsening symptoms or side effects Provided emergency contact info Discussed potential benefits, risks, and side effects of stimulants with patient to include increased heart rate, palpitations, insomnia, increased anxiety, increased irritability, or decreased appetite.  Instructed patient to contact office if experiencing any significant tolerability issues.  Reviewed PDMP       Joan Flores, NP

## 2023-08-12 ENCOUNTER — Telehealth: Payer: Self-pay | Admitting: Behavioral Health

## 2023-08-12 NOTE — Telephone Encounter (Signed)
 LF 2/19, due 3/19

## 2023-08-12 NOTE — Telephone Encounter (Signed)
 Pt called at 1:05p requesting refill of Adderall to  [facility] [id number] [place], [place] - [address] [place], [place] [address] Phone: [phone]  Fax: [phone]   Next appt 6/19

## 2023-08-13 NOTE — Telephone Encounter (Signed)
 Pt LVM @ 3:11a requesting status of Adderall script she called for earlier.

## 2023-08-13 NOTE — Telephone Encounter (Signed)
 As already noted, patient is not due for a RF until tomorrow. Patient was notified.

## 2023-08-15 ENCOUNTER — Other Ambulatory Visit: Payer: Self-pay

## 2023-08-15 DIAGNOSIS — F902 Attention-deficit hyperactivity disorder, combined type: Secondary | ICD-10-CM

## 2023-08-15 MED ORDER — AMPHETAMINE-DEXTROAMPHETAMINE 20 MG PO TABS
20.0000 mg | ORAL_TABLET | Freq: Three times a day (TID) | ORAL | 0 refills | Status: DC | PRN
Start: 2023-09-12 — End: 2023-12-12

## 2023-08-15 MED ORDER — AMPHETAMINE-DEXTROAMPHETAMINE 20 MG PO TABS
20.0000 mg | ORAL_TABLET | Freq: Three times a day (TID) | ORAL | 0 refills | Status: DC
Start: 2023-08-15 — End: 2023-12-12

## 2023-08-15 MED ORDER — AMPHETAMINE-DEXTROAMPHETAMINE 20 MG PO TABS
20.0000 mg | ORAL_TABLET | Freq: Three times a day (TID) | ORAL | 0 refills | Status: DC | PRN
Start: 2023-10-10 — End: 2023-11-14

## 2023-09-11 ENCOUNTER — Telehealth: Payer: Self-pay | Admitting: Behavioral Health

## 2023-09-11 NOTE — Telephone Encounter (Signed)
 Patient called in for refill on Adderall 20mg . Ph: [phone] Appt 6/19 Pharmacy [facility] [address] [place], [place]

## 2023-09-11 NOTE — Telephone Encounter (Addendum)
 She has a RF for Adderall pended to [facility] that is due tomorrow. Last RF request she had asked for 3 RF.

## 2023-09-11 NOTE — Telephone Encounter (Signed)
 Pt notified of Rx for fill tomorrow and that she also has a RF for May as well.

## 2023-11-14 ENCOUNTER — Encounter: Payer: Self-pay | Admitting: Behavioral Health

## 2023-11-14 ENCOUNTER — Ambulatory Visit (INDEPENDENT_AMBULATORY_CARE_PROVIDER_SITE_OTHER): Payer: Self-pay | Admitting: Behavioral Health

## 2023-11-14 ENCOUNTER — Ambulatory Visit: Payer: Self-pay | Admitting: Behavioral Health

## 2023-11-14 DIAGNOSIS — F902 Attention-deficit hyperactivity disorder, combined type: Secondary | ICD-10-CM

## 2023-11-14 MED ORDER — AMPHETAMINE-DEXTROAMPHETAMINE 20 MG PO TABS: 20.0000 mg | ORAL_TABLET | Freq: Three times a day (TID) | ORAL | 0 refills | Status: DC | PRN

## 2023-11-14 NOTE — Progress Notes (Signed)
 [facility]  Patient ID: Catherine Sanchez,  MRN: [id number]  PCP: Harwood Lingo, FNP  Date of Evaluation: 11/14/2023 Time spent:20 minutes  Chief Complaint:  Chief Complaint   ADHD; Follow-up; Patient Education; Medication Refill     HISTORY/CURRENT STATUS: HPI  68 year old female presents to this office for follow up and medication management.  No changes this visit.Says that she continues to do very well on Adderall 20 mg three times daily.  She denies any depression or anxiety at this time or any significant medical health issues. Denies depression, denies anxiety.  Says that she is sleeping good  7-8 hours per night. Denies mania, no psychosis, no SI/HI.   Past psychiatric medications: Adderall Xanax   Individual Medical History/ Review of Systems: Changes? :No   Allergies: Penicillins  Current Medications:  Current Outpatient Medications:    amphetamine -dextroamphetamine  (ADDERALL) 20 MG tablet, Take 1 tablet (20 mg total) by mouth 3 (three) times daily., Disp: 90 tablet, Rfl: 0   amphetamine -dextroamphetamine  (ADDERALL) 20 MG tablet, Take 1 tablet (20 mg total) by mouth 3 (three) times daily as needed., Disp: 90 tablet, Rfl: 0   amphetamine -dextroamphetamine  (ADDERALL) 20 MG tablet, Take 1 tablet (20 mg total) by mouth 3 (three) times daily as needed., Disp: 90 tablet, Rfl: 0 Medication Side Effects: none  Family Medical/ Social History: Changes? No  MENTAL HEALTH EXAM:  There were no vitals taken for this visit.There is no height or weight on file to calculate BMI.  General Appearance: Casual, Neat, and Well Groomed  Eye Contact:  NA  Speech:  Clear and Coherent  Volume:  Normal  Mood:  NA  Affect:  Appropriate  Thought Process:  Coherent  Orientation:  Full (Time, Place, and Person)  Thought Content: Logical   Suicidal Thoughts:  No  Homicidal Thoughts:  No  Memory:  WNL  Judgement:  Good  Insight:  Good  Psychomotor Activity:  Normal   Concentration:  Concentration: Good  Recall:  Good  Fund of Knowledge: Good  Language: Good  Assets:  Desire for Improvement  ADL's:  Intact  Cognition: WNL  Prognosis:  Good    DIAGNOSES:    ICD-10-CM   1. Attention deficit hyperactivity disorder (ADHD), combined type  F90.2 amphetamine -dextroamphetamine  (ADDERALL) 20 MG tablet      Receiving Psychotherapy: No    RECOMMENDATIONS:   Greater than 50% of 30 min face to face time with patient was spent on counseling and coordination of care. No changes this visit. We discussed her improvement with attention and focus.  She is very happy with her medications right now. Requesting no changes to medications at this time.  We agreed to continue:   Adderall 20 mg IR three times per day To follow up in 3 months to reassess Will call if worsening symptoms or side effects Provided emergency contact info Discussed potential benefits, risks, and side effects of stimulants with patient to include increased heart rate, palpitations, insomnia, increased anxiety, increased irritability, or decreased appetite.  Instructed patient to contact office if experiencing any significant tolerability issues.  Reviewed PDMP       Lincoln Renshaw, NP

## 2023-12-12 ENCOUNTER — Telehealth: Payer: Self-pay | Admitting: Behavioral Health

## 2023-12-12 ENCOUNTER — Other Ambulatory Visit: Payer: Self-pay

## 2023-12-12 DIAGNOSIS — F902 Attention-deficit hyperactivity disorder, combined type: Secondary | ICD-10-CM

## 2023-12-12 MED ORDER — AMPHETAMINE-DEXTROAMPHETAMINE 20 MG PO TABS: 20.0000 mg | ORAL_TABLET | Freq: Three times a day (TID) | ORAL | 0 refills | Status: DC

## 2023-12-12 MED ORDER — AMPHETAMINE-DEXTROAMPHETAMINE 20 MG PO TABS: 20.0000 mg | ORAL_TABLET | Freq: Three times a day (TID) | ORAL | 0 refills | Status: DC | PRN

## 2023-12-12 MED ORDER — AMPHETAMINE-DEXTROAMPHETAMINE 20 MG PO TABS
20.0000 mg | ORAL_TABLET | Freq: Three times a day (TID) | ORAL | 0 refills | Status: DC | PRN
Start: 2024-02-06 — End: 2024-02-10

## 2023-12-12 NOTE — Telephone Encounter (Signed)
 Pended 3 RF of Adderall #90 to [facility]

## 2023-12-12 NOTE — Telephone Encounter (Signed)
 Pt requesting Adderall Rx to [facility]. Apt 10/15

## 2024-02-10 ENCOUNTER — Telehealth: Payer: Self-pay | Admitting: Behavioral Health

## 2024-02-10 ENCOUNTER — Other Ambulatory Visit: Payer: Self-pay

## 2024-02-10 DIAGNOSIS — F902 Attention-deficit hyperactivity disorder, combined type: Secondary | ICD-10-CM

## 2024-02-10 MED ORDER — AMPHETAMINE-DEXTROAMPHETAMINE 20 MG PO TABS
20.0000 mg | ORAL_TABLET | Freq: Three times a day (TID) | ORAL | 0 refills | Status: DC | PRN
Start: 2024-03-09 — End: 2024-03-10

## 2024-02-10 MED ORDER — AMPHETAMINE-DEXTROAMPHETAMINE 20 MG PO TABS
20.0000 mg | ORAL_TABLET | Freq: Three times a day (TID) | ORAL | 0 refills | Status: DC | PRN
Start: 2024-02-10 — End: 2024-04-08

## 2024-02-10 NOTE — Telephone Encounter (Signed)
 Pended

## 2024-02-10 NOTE — Telephone Encounter (Signed)
 Patient called in for refill on Adderall 20mg . Ph: [phone] Appt 10/15 Pharmacy [facility] [address] [place]

## 2024-03-10 ENCOUNTER — Encounter: Payer: Self-pay | Admitting: Behavioral Health

## 2024-03-10 ENCOUNTER — Ambulatory Visit (INDEPENDENT_AMBULATORY_CARE_PROVIDER_SITE_OTHER): Payer: Self-pay | Admitting: Behavioral Health

## 2024-03-10 DIAGNOSIS — F902 Attention-deficit hyperactivity disorder, combined type: Secondary | ICD-10-CM

## 2024-03-10 MED ORDER — AMPHETAMINE-DEXTROAMPHETAMINE 20 MG PO TABS: 20.0000 mg | ORAL_TABLET | Freq: Three times a day (TID) | ORAL | 0 refills | Status: DC | PRN

## 2024-03-10 NOTE — Progress Notes (Signed)
 [facility]  Patient ID: Catherine Sanchez,  MRN: [id number]  PCP: Baldwin Powell BROCKS, FNP  Date of Evaluation: 03/10/2024 Time spent:20 minutes  Chief Complaint:  Chief Complaint   ADHD; Follow-up; Medication Refill; Patient Education     HISTORY/CURRENT STATUS: HPI  68 year old female presents to this office for follow up and medication management.  No psychosocial changes this visit. Says that she continues to do very well on Adderall 20 mg three times daily.  She denies any depression or anxiety at this time or any significant medical health issues. Denies depression, denies anxiety.  Says that she is sleeping good  7-8 hours per night. Denies mania, no psychosis, no SI/HI.   Past psychiatric medications: Adderall Xanax       Individual Medical History/ Review of Systems: Changes? :No   Allergies: Penicillins  Current Medications:  Current Outpatient Medications:    amphetamine -dextroamphetamine  (ADDERALL) 20 MG tablet, Take 1 tablet (20 mg total) by mouth 3 (three) times daily., Disp: 90 tablet, Rfl: 0   amphetamine -dextroamphetamine  (ADDERALL) 20 MG tablet, Take 1 tablet (20 mg total) by mouth 3 (three) times daily as needed., Disp: 90 tablet, Rfl: 0   amphetamine -dextroamphetamine  (ADDERALL) 20 MG tablet, Take 1 tablet (20 mg total) by mouth 3 (three) times daily as needed., Disp: 90 tablet, Rfl: 0 Medication Side Effects: none  Family Medical/ Social History: Changes? No  MENTAL HEALTH EXAM:  There were no vitals taken for this visit.There is no height or weight on file to calculate BMI.  General Appearance: Casual, Neat, and Well Groomed  Eye Contact:  Good  Speech:  Clear and Coherent  Volume:  Normal  Mood:  NA  Affect:  Appropriate  Thought Process:  Coherent  Orientation:  Full (Time, Place, and Person)  Thought Content: Logical   Suicidal Thoughts:  No  Homicidal Thoughts:  No  Memory:  WNL  Judgement:  Good  Insight:  Good   Psychomotor Activity:  Normal  Concentration:  Concentration: Good  Recall:  Good  Fund of Knowledge: Good  Language: Good  Assets:  Desire for Improvement  ADL's:  Intact  Cognition: WNL  Prognosis:  Good    DIAGNOSES:    ICD-10-CM   1. Attention deficit hyperactivity disorder (ADHD), combined type  F90.2 amphetamine -dextroamphetamine  (ADDERALL) 20 MG tablet      Receiving Psychotherapy: No      RECOMMENDATIONS:   Greater than 50% of 30 min face to face time with patient was spent on counseling and coordination of care. No changes this visit. We discussed her improvement with attention and focus.  She is very happy with her medications right now. Requesting no changes to medications at this time.  We agreed to continue:   Adderall 20 mg IR three times per day To follow up in 3 months to reassess Will call if worsening symptoms or side effects Provided emergency contact info Discussed potential benefits, risks, and side effects of stimulants with patient to include increased heart rate, palpitations, insomnia, increased anxiety, increased irritability, or decreased appetite.  Instructed patient to contact office if experiencing any significant tolerability issues.  Reviewed PDMP    Redell DELENA Pizza, NP

## 2024-03-11 ENCOUNTER — Ambulatory Visit: Payer: Self-pay | Admitting: Behavioral Health

## 2024-04-08 ENCOUNTER — Telehealth: Payer: Self-pay | Admitting: Behavioral Health

## 2024-04-08 ENCOUNTER — Other Ambulatory Visit: Payer: Self-pay

## 2024-04-08 DIAGNOSIS — F902 Attention-deficit hyperactivity disorder, combined type: Secondary | ICD-10-CM

## 2024-04-08 MED ORDER — AMPHETAMINE-DEXTROAMPHETAMINE 20 MG PO TABS: 20.0000 mg | ORAL_TABLET | Freq: Three times a day (TID) | ORAL | 0 refills | Status: DC

## 2024-04-08 MED ORDER — AMPHETAMINE-DEXTROAMPHETAMINE 20 MG PO TABS: 20.0000 mg | ORAL_TABLET | Freq: Three times a day (TID) | ORAL | 0 refills | Status: DC | PRN

## 2024-04-08 NOTE — Telephone Encounter (Signed)
 Pended 3 RF of Adderall 20 to HT

## 2024-04-08 NOTE — Telephone Encounter (Signed)
 Catherine Sanchez called asking for a refill on her adderall 20 mg. Pharmacy is [facility] [address]

## 2024-07-03 ENCOUNTER — Other Ambulatory Visit: Payer: Self-pay

## 2024-07-03 ENCOUNTER — Telehealth: Payer: Self-pay | Admitting: Behavioral Health

## 2024-07-03 DIAGNOSIS — F902 Attention-deficit hyperactivity disorder, combined type: Secondary | ICD-10-CM

## 2024-07-03 MED ORDER — AMPHETAMINE-DEXTROAMPHETAMINE 20 MG PO TABS: 20.0000 mg | ORAL_TABLET | Freq: Three times a day (TID) | ORAL | 0 refills | Status: AC | PRN

## 2024-07-03 MED ORDER — AMPHETAMINE-DEXTROAMPHETAMINE 20 MG PO TABS: 20.0000 mg | ORAL_TABLET | Freq: Three times a day (TID) | ORAL | 0 refills | Status: AC

## 2024-07-03 NOTE — Telephone Encounter (Signed)
 Patient called in for refill on Adderall 20mg . PH: [phone] Appt 4/14 [facility] [address] [place]

## 2024-07-03 NOTE — Telephone Encounter (Signed)
Pended 3 RF.

## 2024-09-08 ENCOUNTER — Ambulatory Visit: Payer: Self-pay | Admitting: Behavioral Health
